# Patient Record
Sex: Female | Born: 1985 | Race: Black or African American | Hispanic: No | Marital: Single | Smoking: Never smoker
Health system: Southern US, Community
[De-identification: ages and names within clinical notes are randomized; demographics above are authoritative.]

## PROBLEM LIST (undated history)

## (undated) ENCOUNTER — Inpatient Hospital Stay (HOSPITAL_COMMUNITY): Payer: Self-pay

## (undated) DIAGNOSIS — A749 Chlamydial infection, unspecified: Secondary | ICD-10-CM

## (undated) DIAGNOSIS — R51 Headache: Secondary | ICD-10-CM

## (undated) DIAGNOSIS — O139 Gestational [pregnancy-induced] hypertension without significant proteinuria, unspecified trimester: Secondary | ICD-10-CM

## (undated) HISTORY — PX: WISDOM TOOTH EXTRACTION: SHX21

## (undated) HISTORY — DX: Headache: R51

## (undated) HISTORY — DX: Gestational (pregnancy-induced) hypertension without significant proteinuria, unspecified trimester: O13.9

## (undated) HISTORY — DX: Chlamydial infection, unspecified: A74.9

---

## 2005-12-30 ENCOUNTER — Emergency Department (HOSPITAL_COMMUNITY): Admission: EM | Admit: 2005-12-30 | Discharge: 2005-12-30 | Payer: Self-pay | Admitting: Emergency Medicine

## 2007-03-21 ENCOUNTER — Ambulatory Visit (HOSPITAL_COMMUNITY): Admission: RE | Admit: 2007-03-21 | Discharge: 2007-03-21 | Payer: Self-pay | Admitting: Obstetrics & Gynecology

## 2007-04-28 ENCOUNTER — Inpatient Hospital Stay (HOSPITAL_COMMUNITY): Admission: AD | Admit: 2007-04-28 | Discharge: 2007-04-30 | Payer: Self-pay | Admitting: Obstetrics & Gynecology

## 2007-04-28 ENCOUNTER — Ambulatory Visit: Payer: Self-pay | Admitting: *Deleted

## 2007-05-14 ENCOUNTER — Ambulatory Visit: Payer: Self-pay | Admitting: Obstetrics & Gynecology

## 2008-09-28 IMAGING — US US OB COMP +14 WK
1 series · 14 of 28 positions shown · non-contrast
Comparison: none

OBSTETRICAL ULTRASOUND:

 This ultrasound exam was performed in the [HOSPITAL] Ultrasound Department.  The OB US report was generated in the AS system, and faxed to the ordering physician.  This report is also available in [REDACTED] PACS.

[Series 1: us ob comp +14 wk · 0.35mm/px · 14 of 52 slices shown]
[im 2/52]
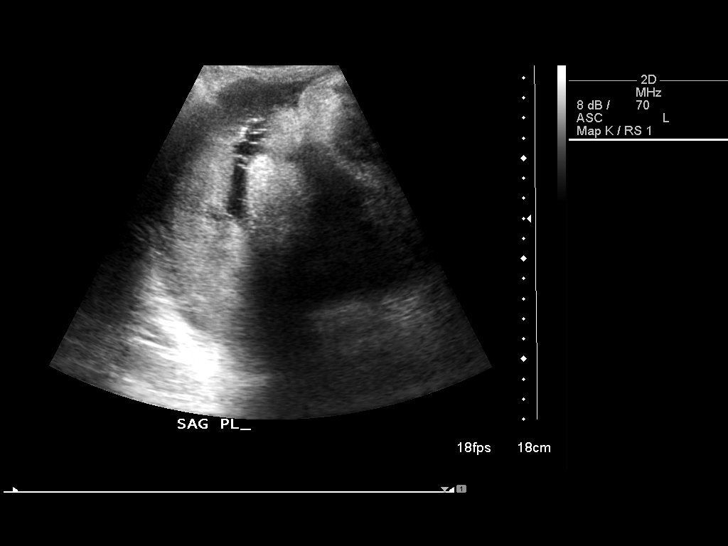
[im 6/52]
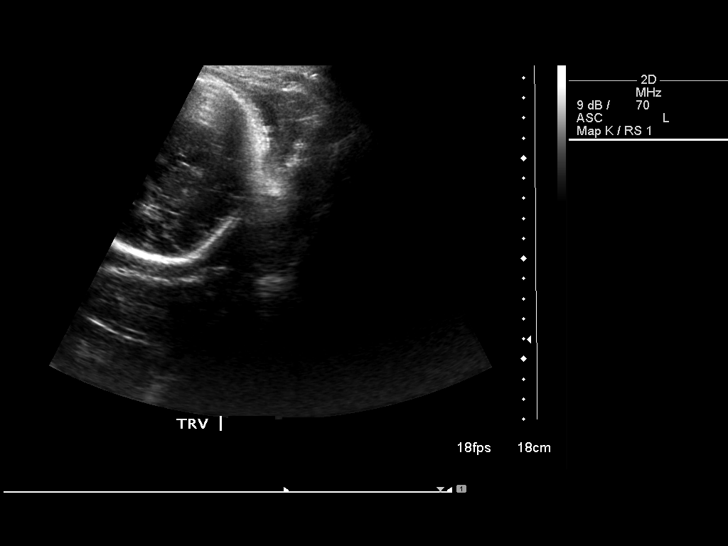
[im 10/52]
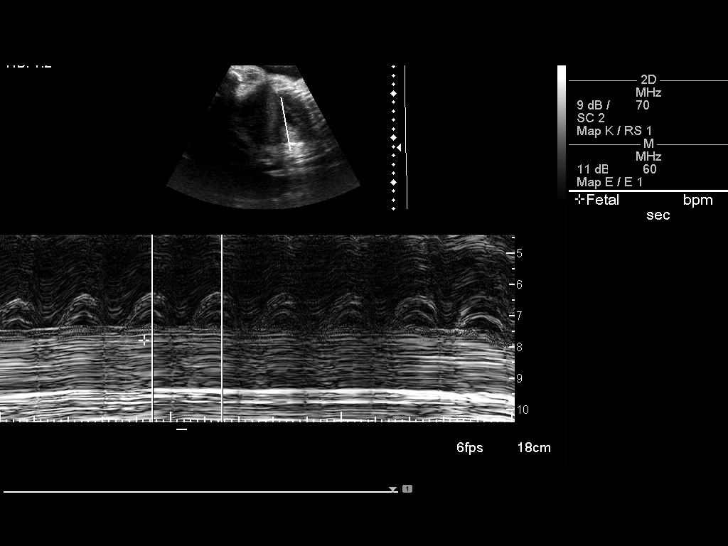
[im 14/52]
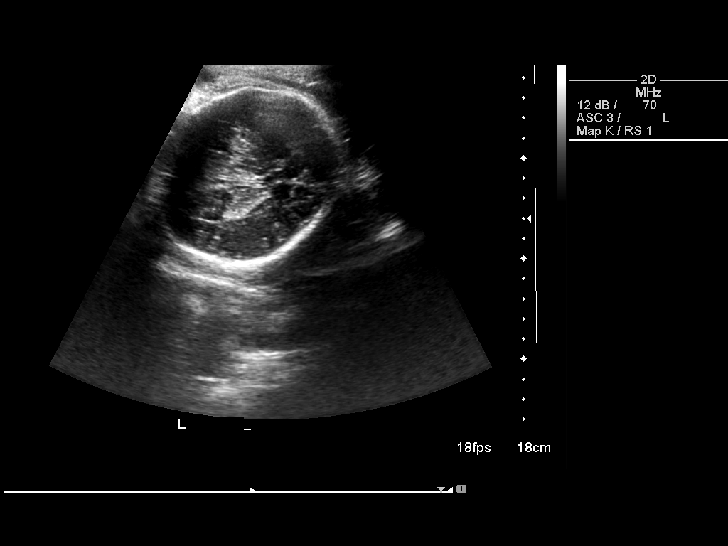
[im 18/52]
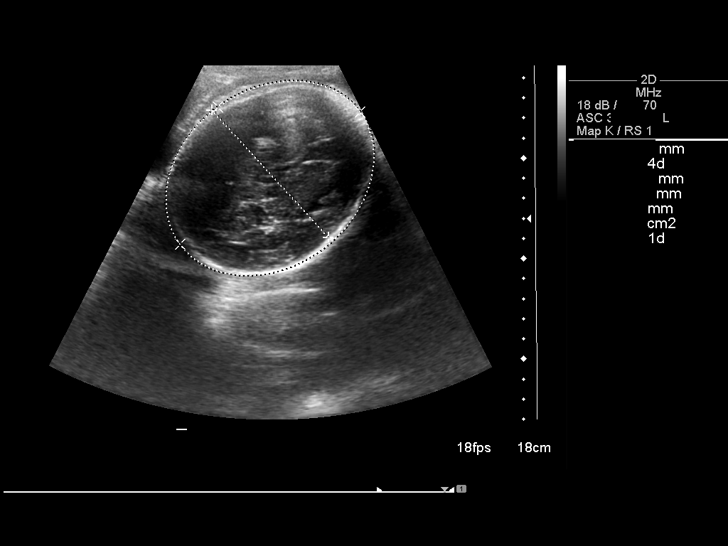
[im 21/52]
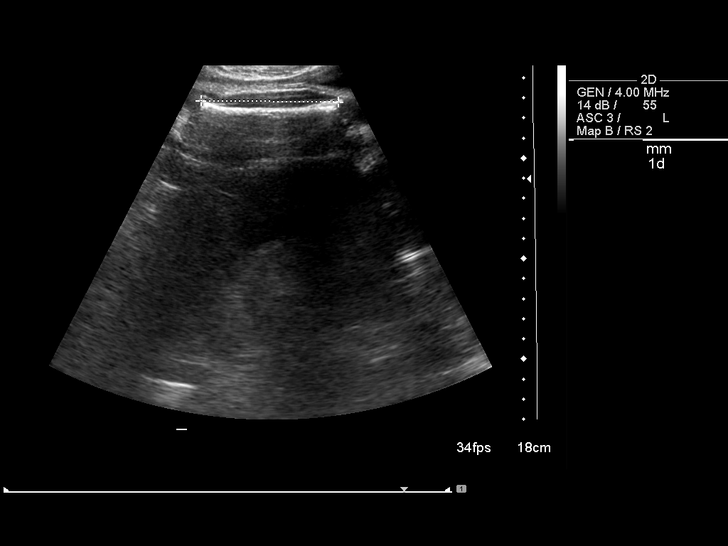
[im 25/52]
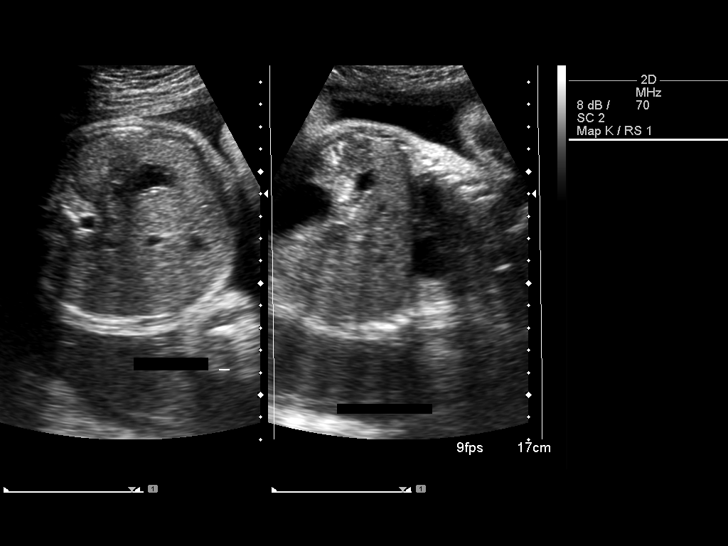
[im 29/52]
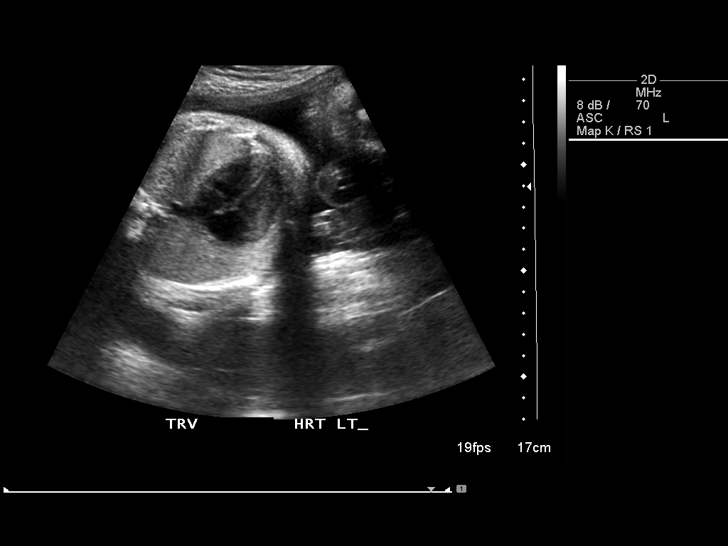
[im 33/52]
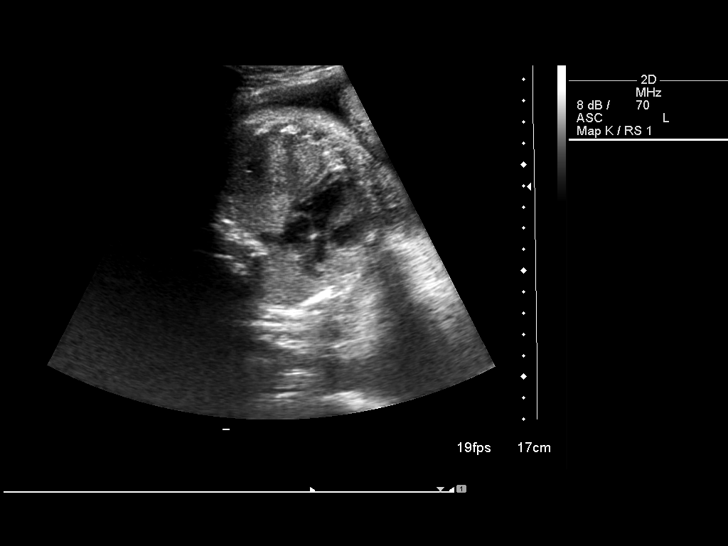
[im 36/52]
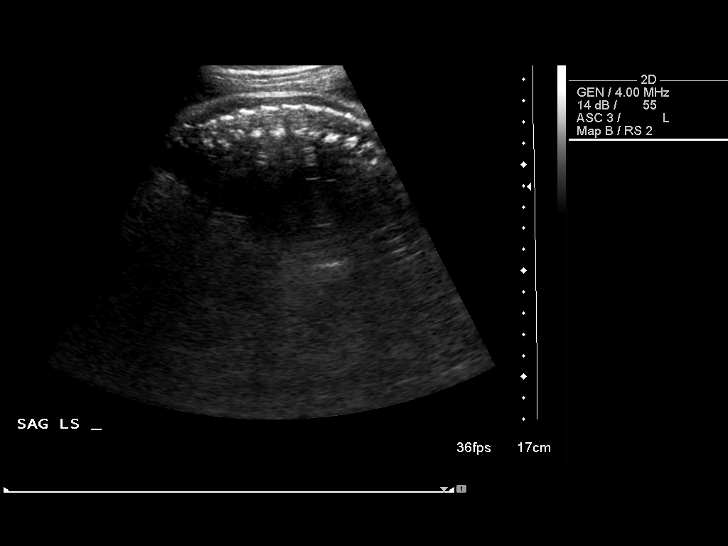
[im 40/52]
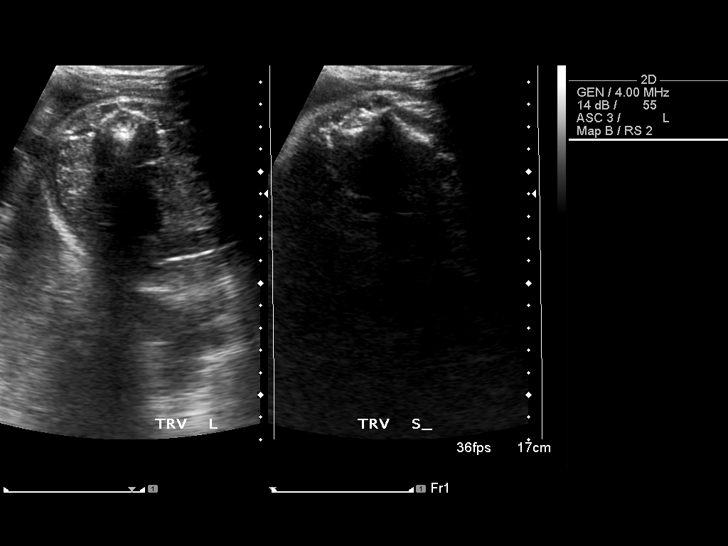
[im 44/52]
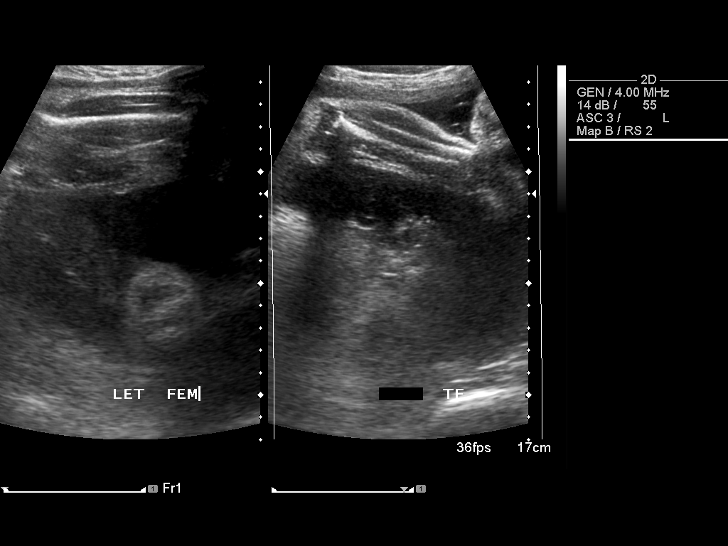
[im 48/52]
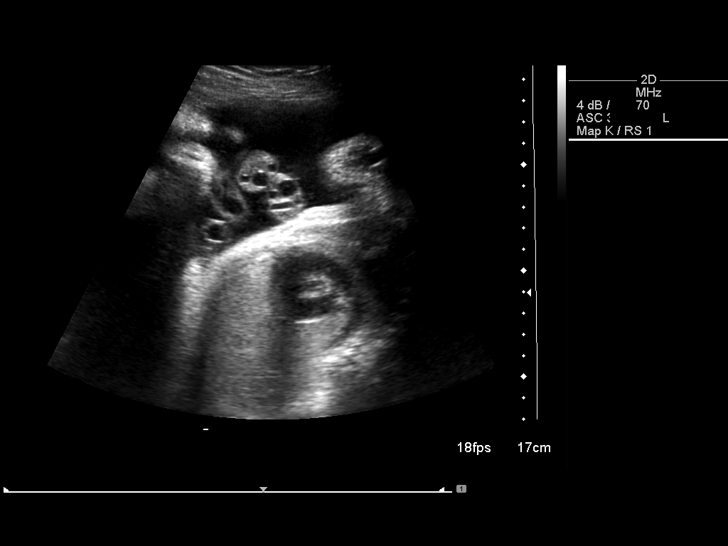
[im 52/52]
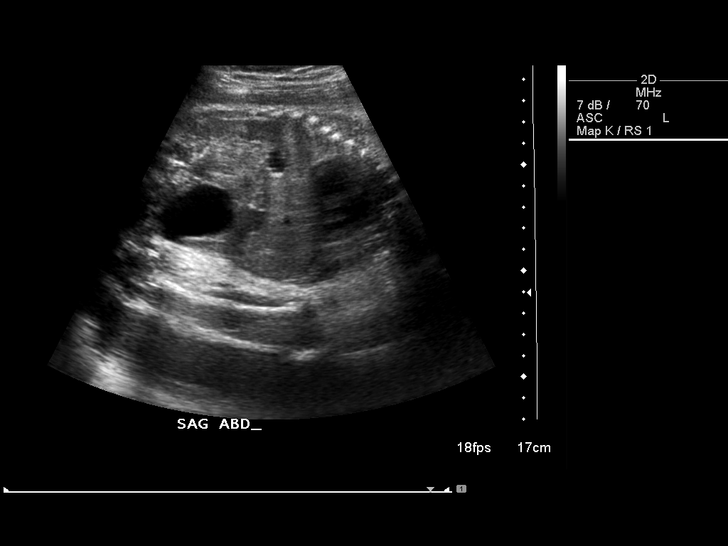

[14 of 28 positions shown; findings below may reference images not displayed]

IMPRESSION: See AS Obstetric US report.

## 2010-12-05 DIAGNOSIS — A749 Chlamydial infection, unspecified: Secondary | ICD-10-CM

## 2010-12-05 HISTORY — DX: Chlamydial infection, unspecified: A74.9

## 2011-01-02 ENCOUNTER — Ambulatory Visit
Admission: RE | Admit: 2011-01-02 | Discharge: 2011-01-02 | Payer: Self-pay | Source: Home / Self Care | Attending: Internal Medicine | Admitting: Internal Medicine

## 2011-01-02 ENCOUNTER — Encounter: Payer: Self-pay | Admitting: Internal Medicine

## 2011-01-02 DIAGNOSIS — IMO0002 Reserved for concepts with insufficient information to code with codable children: Secondary | ICD-10-CM | POA: Insufficient documentation

## 2011-01-02 DIAGNOSIS — G43009 Migraine without aura, not intractable, without status migrainosus: Secondary | ICD-10-CM | POA: Insufficient documentation

## 2011-01-02 LAB — CONVERTED CEMR LAB
ALT: 31 units/L (ref 0–35)
Albumin: 4.3 g/dL (ref 3.5–5.2)
Alkaline Phosphatase: 67 units/L (ref 39–117)
Basophils Absolute: 0 10*3/uL (ref 0.0–0.1)
Bilirubin, Direct: 0.1 mg/dL (ref 0.0–0.3)
Calcium: 9.9 mg/dL (ref 8.4–10.5)
Cholesterol: 149 mg/dL (ref 0–200)
Creatinine, Ser: 0.6 mg/dL (ref 0.4–1.2)
GFR calc non Af Amer: 156.96 mL/min (ref >60.00)
Glucose, Bld: 79 mg/dL (ref 70–99)
Hemoglobin: 15.3 g/dL — ABNORMAL HIGH (ref 12.0–15.0)
Lymphocytes Relative: 31.9 % (ref 12.0–46.0)
Monocytes Relative: 8.3 % (ref 3.0–12.0)
Neutro Abs: 2.4 10*3/uL (ref 1.4–7.7)
Neutrophils Relative %: 56.9 % (ref 43.0–77.0)
Platelets: 242 10*3/uL (ref 150.0–400.0)
RDW: 13.2 % (ref 11.5–14.6)
Sodium: 139 meq/L (ref 135–145)
Specific Gravity, Urine: 1.025 (ref 1.000–1.030)
Total Bilirubin: 1 mg/dL (ref 0.3–1.2)
Total Protein, Urine: NEGATIVE mg/dL
Total Protein: 7.7 g/dL (ref 6.0–8.3)
Triglycerides: 35 mg/dL (ref 0.0–149.0)
Urine Glucose: NEGATIVE mg/dL
Urobilinogen, UA: 0.2 (ref 0.0–1.0)
pH: 6 (ref 5.0–8.0)

## 2011-01-03 LAB — CONVERTED CEMR LAB
Chlamydia, Swab/Urine, PCR: NEGATIVE
GC Probe Amp, Urine: NEGATIVE

## 2011-02-01 NOTE — Assessment & Plan Note (Signed)
Summary: new pt/bcbs/#/lb   Vital Signs:  Patient profile:   25 year old female Height:      64 inches Weight:      156.50 pounds BMI:     26.96 O2 Sat:      98 % on Room air Temp:     99.4 degrees F oral Pulse rate:   79 / minute BP sitting:   110 / 82  (left arm) Cuff size:   regular  Vitals Entered By: Zella Ball Ewing CMA Duncan Dull) (January 02, 2011 9:40 AM)  O2 Flow:  Room air  Preventive Care Screening  Pap Smear:    Date:  12/05/2010    Results:  normal   Last Tetanus Booster:    Date:  08/31/2010    Results:  Historical      had flu shot at work earlier this season  CC: New Patient/RE   CC:  New Patient/RE.  History of Present Illness: here for wellness and medical complaints;   overall doing ok for the most part and Pt denies CP, worsening sob, doe, wheezing, orthopnea, pnd, worsening LE edema, palps, dizziness or syncope  Pt denies new neuro symptoms such as  facial or extremity weakness  Pt denies polydipsia, polyuria   Overall good compliance with meds, not really trying to follow low chol diet, wt stable, little excercise however  No fever, wt loss, night sweats, loss of appetite or other constitutional symptoms  Overall good compliance with meds, and good tolerability. Denies worsening depressive symptoms, suicidal ideation, or panic.  Pt states good ability with ADL's, low fall risk, home safety reviewed and adequate, no significant change in hearing or vision, trying to follow lower chol diet, and occasionally active only with regular excercise. Does have occaisonal migraine type HA with throbbing more on the right forehead area, without n/v.  Has elev BP at home with the HA's but not o/w  Pt asks for STD check/labs today though she has not had specific symtpoms such as vag d/c, rash/sore/ulcers/pain.  Did incidently have mild URI in the past wk, but seems mostly improved now, is unaware of any fever today.  No chills, ST, cough.  Preventive Screening-Counseling &  Management  Alcohol-Tobacco     Smoking Status: never      Drug Use:  no.    Problems Prior to Update: 1)  Sexually Transmitted Disease, Exposure To  (ICD-V01.6) 2)  Hypertension, Gestational  (ICD-642.90) 3)  Migraine, Common  (ICD-346.10)  Medications Prior to Update: 1)  None  Current Medications (verified): 1)  Sumatriptan Succinate 100 Mg Tabs (Sumatriptan Succinate) .... Use Asd 1 By Mouth Every Other Day As Needed 2)  Excedrin Migraine 250-250-65 Mg Tabs (Aspirin-Acetaminophen-Caffeine) .Marland Kitchen.. 1po Every Other Day As Needed  Allergies (verified): No Known Drug Allergies  Past History:  Family History: Last updated: 01/02/2011 grandparents with DM, HTN father with gout p-aunt with lung cancer m-uncle with lung cancer brother with HTN (14 yrs older than the pt)  Social History: Last updated: 01/02/2011 Single 1 child  work  - Therapist, occupational -  Alcohol use-yes Never Smoked Drug use-no  Risk Factors: Smoking Status: never (01/02/2011)  Past Medical History: migraine  hx of gestational HTN  Past Surgical History: Denies surgical history  Family History: Reviewed history and no changes required. grandparents with DM, HTN father with gout p-aunt with lung cancer m-uncle with lung cancer brother with HTN (14 yrs older than the pt)  Social History: Reviewed history and no changes  required. Single 1 child  work  - Therapist, occupational -  Alcohol use-yes Never Smoked Drug use-no Smoking Status:  never Drug Use:  no  Review of Systems  The patient denies anorexia, fever, vision loss, decreased hearing, hoarseness, chest pain, syncope, dyspnea on exertion, peripheral edema, prolonged cough, hemoptysis, abdominal pain, melena, hematochezia, severe indigestion/heartburn, hematuria, incontinence, suspicious skin lesions, transient blindness, difficulty walking, depression, unusual weight change, abnormal bleeding, enlarged lymph nodes, and angioedema.         all  otherwise negative per pt -  no GU symptoms such as pain on urination, freq, urgency, hematuria  Physical Exam  General:  alert and overweight-appearing.  , not ill appearing Head:  normocephalic and atraumatic.   Eyes:  vision grossly intact, pupils equal, and pupils round.   Ears:  R ear normal and L ear normal.   Nose:  no external deformity and no nasal discharge.   Mouth:  pharyngeal erythema and fair dentition.   Neck:  supple and no masses.   Lungs:  normal respiratory effort and normal breath sounds.   Heart:  normal rate and regular rhythm.   Abdomen:  soft, non-tender, and normal bowel sounds.   Msk:  no joint tenderness and no joint swelling.   Extremities:  no edema, no erythema  Neurologic:  strength normal in all extremities, sensation intact to light touch, and gait normal.   Skin:  color normal and no rashes.   Psych:  not depressed appearing and slightly anxious.     Impression & Recommendations:  Problem # 1:  Preventive Health Care (ICD-V70.0) Overall doing well, age appropriate education and counseling updated, referral for preventive services and immunizations addressed, dietary counseling and smoking status adressed , most recent labs reviewed I have personally reviewed and have noted 1.The patient's medical and social history 2.Their use of alcohol, tobacco or illicit drugs 3.Their current medications and supplements 4. Functional ability including ADL's, fall risk, home safety risk, hearing & visual impairment  5.Diet and physical activities 6.Evidence for depression or mood disorders The patients weight, height, BMI  have been recorded in the chart I have made referrals, counseling and provided education to the patient based review of the above   also for Gardasil #1 today with f/u shots at 2 and 6 mo to be scheduled  Problem # 2:  MIGRAINE, COMMON (ICD-346.10)  Her updated medication list for this problem includes:    Sumatriptan Succinate 100 Mg Tabs  (Sumatriptan succinate) ..... Use asd 1 by mouth every other day as needed    Excedrin Migraine 250-250-65 Mg Tabs (Aspirin-acetaminophen-caffeine) .Marland Kitchen... 1po every other day as needed treat as above, f/u any worsening signs or symptoms  - with the sumatriptan for the more severe HA's, consider HA wellness referral  Problem # 3:  SEXUALLY TRANSMITTED DISEASE, EXPOSURE TO (ICD-V01.6) pt reqeusts lab eval, though states asympt, GU exam deferred Orders: T-HIV-1 (Screen) (47829) T-Herpes Simplex Type 2 (56213-08657) T-Syphilis Test (RPR) [phone]) T-Chlamydia & GC Probe, Urine (87491/[id number]) TLB-BMP (Basic Metabolic Panel-BMET) (80048-METABOL) TLB-CBC Platelet - w/Differential (85025-CBCD) TLB-Hepatic/Liver Function Pnl (80076-HEPATIC) TLB-Lipid Panel (80061-LIPID) TLB-TSH (Thyroid Stimulating Hormone) (84443-TSH) TLB-Udip ONLY (81003-UDIP)  Complete Medication List: 1)  Sumatriptan Succinate 100 Mg Tabs (Sumatriptan succinate) .... Use asd 1 by mouth every other day as needed 2)  Excedrin Migraine 250-250-65 Mg Tabs (Aspirin-acetaminophen-caffeine) .Marland Kitchen.. 1po every other day as needed  Other Orders: HPV Vaccine - 3 sched doses - IM (41324) Admin 1st Vaccine (40102)  Patient Instructions: 1)  you had the Gardasil shot #1 today 2)  Please make Nurse visit for Gardasil #2 shot in 2 months, and Gardisil #3 shot in 6 months 3)  Please go to the Lab in the basement for your blood and/or urine tests today  4)  Please call the number on the [facility] Card for results of your testing  5)  Please take all new medications as prescribed  - the generic imitrex for the headaches as needed 6)  Continue all previous medications as before this visit 7)  You can also take Excedrin Migraine OTC for milder migraine 8)  Please schedule a follow-up appointment in 1 year or as needed Prescriptions: SUMATRIPTAN SUCCINATE 100 MG TABS (SUMATRIPTAN SUCCINATE) use asd 1 by mouth every other day as needed  #9 x  11   Entered and Authorized by:   Corwin Levins MD   Signed by:   Corwin Levins MD on 01/02/2011   Method used:   Print then Give to Patient   RxID:   [id number]    Orders Added: 1)  T-HIV-1 (Screen) [63875] 2)  T-Herpes Simplex Type 2 [64332-95188] 3)  T-Syphilis Test (RPR) [41660-63016] 4)  T-Chlamydia & GC Probe, Urine [87491/[id number]] 5)  HPV Vaccine - 3 sched doses - IM [90649] 6)  Admin 1st Vaccine [90471] 7)  TLB-BMP (Basic Metabolic Panel-BMET) [80048-METABOL] 8)  TLB-CBC Platelet - w/Differential [85025-CBCD] 9)  TLB-Hepatic/Liver Function Pnl [80076-HEPATIC] 10)  TLB-Lipid Panel [80061-LIPID] 11)  TLB-TSH (Thyroid Stimulating Hormone) [84443-TSH] 12)  TLB-Udip ONLY [81003-UDIP] 13)  Est. Patient 18-39 years [99395]   Immunization History:  Tetanus/Td Immunization History:    Tetanus/Td:  historical (08/31/2010)  Immunizations Administered:  HPV # 1:    Vaccine Type: Gardasil    Site: left buttock    Mfr: Merck    Dose: 0.5 ml    Route: IM    Given by: Zella Ball Ewing CMA (AAMA)    Exp. Date: 04/07/2012    Lot #: [id number]    VIS given: 05/02/10 version given January 02, 2011.   Immunization History:  Tetanus/Td Immunization History:    Tetanus/Td:  Historical (08/31/2010)  Immunizations Administered:  HPV # 1:    Vaccine Type: Gardasil    Site: left buttock    Mfr: Merck    Dose: 0.5 ml    Route: IM    Given by: Zella Ball Ewing CMA (AAMA)    Exp. Date: 04/07/2012    Lot #: [id number]    VIS given: 05/02/10 version given January 02, 2011.

## 2011-03-06 ENCOUNTER — Ambulatory Visit (INDEPENDENT_AMBULATORY_CARE_PROVIDER_SITE_OTHER): Payer: 59

## 2011-03-06 ENCOUNTER — Encounter: Payer: Self-pay | Admitting: Internal Medicine

## 2011-03-06 DIAGNOSIS — Z23 Encounter for immunization: Secondary | ICD-10-CM

## 2011-03-13 NOTE — Assessment & Plan Note (Signed)
Summary: gardisil/ jwj/ nws  Nurse Visit   Allergies: No Known Drug Allergies  Immunizations Administered:  HPV # 2:    Vaccine Type: Gardasil    Site: left deltoid    Mfr: Merck    Dose: 0.5 ml    Route: IM    Given by: Burnard Leigh CMA(AAMA)    Exp. Date: 04/07/2012    Lot #: [id number]    VIS given: 05/02/10 version given March 06, 2011.  Orders Added: 1)  HPV Vaccine - 3 sched doses - IM [90649] 2)  Admin 1st Vaccine [19147]

## 2011-05-15 NOTE — Discharge Summary (Signed)
Angel Weber, Angel Weber              ACCOUNT NO.:  [id number]   MEDICAL RECORD NO.:  [id number]          PATIENT TYPE:  INP   LOCATION:  9124                          FACILITY:  [facility]   PHYSICIAN:  Paticia Stack, MD     DATE OF BIRTH:  06-14-1986   DATE OF ADMISSION:  04/28/2007  DATE OF DISCHARGE:  04/30/2007                               DISCHARGE SUMMARY   DISCHARGE SUMMARY   ADMISSION DIAGNOSES:  1. Term intrauterine pregnancy at 40+ weeks' gestation.  2. Anemia of pregnancy.  3. Mild preeclampsia.   DISCHARGE DIAGNOSES:  1. Term intrauterine pregnancy, delivered by spontaneous vaginal      delivery.  2. Mild preeclampsia.  3. Anemia of pregnancy.   HOSPITAL COURSE:  This is a 25 year old gravida 1 at 67 weeks 2 days'  gestation who presented in active labor.  On admission, was found to  have elevated blood pressures, ranging 120 to 150 over 80 to 100.  PIH  labs were obtained, which showed platelets at 155, which had been 303  one month prior.  Also, a uric acid of 6.1 and a creatinine of 0.75.  The patient had no symptoms of preeclampsia.  She was started on  magnesium for her elevated blood pressures, and proteinuria.  She did  have 100 protein in her urine.  The patient progressed to deliver a  viable female infant with Apgars 8 and 9 at 1 and 5 minutes, respectively.  Had a second-degree laceration that was repaired with 3-0 repeat.  She  was continued on magnesium sulfate for 24 hours postpartum, started to  diurese well.  Had no chest pain, shortness of breath, or complications.  She was transferred to the floor on postpartum day 1, and continued to  do well postpartum.  On postpartum day 2, the patient was found to have  elevated blood pressure of 156/106.  She was started on atenolol 25 mg  p.o. b.i.d.  Her blood pressure improved with this medicine, and she was  thought to be stable for discharge on postpartum day number 2.   DISPOSITION:  She was discharged with  instructions to follow up at the  GYN clinic in 2 weeks for blood pressure check.  She was given the  telephone number [phone], in which to call and make that appointment.  She is also to follow up at the health department in 6 weeks for a  postpartum visit.  Additional instructions included nothing per vagina x  6 weeks.  No heavy lifting.  Diet was regular.  Activity as tolerated.   DISCHARGE MEDICATIONS:  1. Motrin 600 mg, take 1 q. 6 hours p.r.n. pain.  2. Percocet 5/325 mg, take 1 every 6 hours as needed for pain.  3. Micronor 1 daily.  4. Prenatal vitamins daily.  5. Atenolol 25 mg p.o. b.i.d.   She is also to receive an IUD at her 6-week postpartum visit.           ______________________________  Paticia Stack, MD     LNJ/MEDQ  D:  04/30/2007  T:  04/30/2007  Job:  [id number] 

## 2011-07-03 ENCOUNTER — Ambulatory Visit (INDEPENDENT_AMBULATORY_CARE_PROVIDER_SITE_OTHER): Payer: 59

## 2011-07-03 DIAGNOSIS — Z23 Encounter for immunization: Secondary | ICD-10-CM

## 2011-08-08 ENCOUNTER — Encounter: Payer: Self-pay | Admitting: *Deleted

## 2011-08-08 ENCOUNTER — Emergency Department (HOSPITAL_BASED_OUTPATIENT_CLINIC_OR_DEPARTMENT_OTHER)
Admission: EM | Admit: 2011-08-08 | Discharge: 2011-08-09 | Disposition: A | Discharge status: Home / Self Care | Payer: No Typology Code available for payment source | Attending: Emergency Medicine | Admitting: Emergency Medicine

## 2011-08-08 DIAGNOSIS — Y92410 Unspecified street and highway as the place of occurrence of the external cause: Secondary | ICD-10-CM | POA: Insufficient documentation

## 2011-08-08 DIAGNOSIS — IMO0002 Reserved for concepts with insufficient information to code with codable children: Secondary | ICD-10-CM | POA: Insufficient documentation
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
## 2011-08-09 MED ORDER — IBUPROFEN 600 MG PO TABS: 600.0000 mg | ORAL_TABLET | Freq: Four times a day (QID) | ORAL | Status: AC | PRN

## 2011-08-09 MED ORDER — DIAZEPAM 5 MG PO TABS: 5.0000 mg | ORAL_TABLET | Freq: Two times a day (BID) | ORAL | Status: AC

## 2011-08-09 MED ORDER — OXYCODONE-ACETAMINOPHEN 5-325 MG PO TABS: 2.0000 | ORAL_TABLET | ORAL | Status: AC | PRN

## 2011-08-09 NOTE — Discharge Instructions (Signed)
Shoulder Sprain  A shoulder sprain is the result of damage to the tough, fiber-like tissues (ligaments) that help hold your shoulder in place. The ligaments could be stretched or torn. Besides the main shoulder joint (the ball and socket), there are several smaller joints that connect the bones in this area. A sprain usually involves one of those joints. Most often it is the acromioclavicular (or AC) joint. That is the joint that connects the collarbone (clavicle) and the shoulder blade (scapula) at the top point of the shoulder blade (acromion).  A shoulder sprain is a mild form of what is called a shoulder separation. Recovering from a shoulder sprain may take some time. For some, pain lingers for several months. Most people recover without long term problems.  CAUSES   A shoulder sprain is usually caused by some kind of trauma. This might be:    Falling on an outstretched arm.    Being hit hard on the shoulder.    Twisting the arm.    Shoulder sprains are more likely to occur to people who:    Play sports.    Have balance or coordination problems.   SYMPTOMS   Pain when you move your shoulder.    Limited ability to move the shoulder.    Swelling and tenderness on top of the shoulder.    Redness or warmth in the shoulder.    Bruising.    A change in the shape of the shoulder.   DIAGNOSIS  Your healthcare provider will probably:   Ask about your symptoms.    Ask about recent activity that might have caused those symptoms.    Examine your shoulder. You may be asked to do simple exercises to test movement. The other shoulder will be examined for comparison.    Order some tests that provide a look inside the body. They can show the extent of the injury. The tests could include:    X-rays.    CT (Computed tomography) scan.    MRI (Magnetic resonance imaging) scan.   TREATMENT  How long it takes to recover from a shoulder sprain depends on how severe it was. Treatment options may include:    Rest. You should not use the arm and shoulder until it heals.    Ice. For two or three days after the injury, put an ice pack on the shoulder up to four times a day. It should stay on for 15 to 20 minutes each time. Wrap the ice in a towel so it does not touch your skin.    Over-the-counter medicine to relieve pain.    A sling or brace. This will keep the arm still while the shoulder is healing.    Physical therapy or rehabilitation exercises. These will help you regain strength and motion. Ask your healthcare provider when it is OK to begin these exercises.    Surgery. The need for surgery is rare with a sprained shoulder. Some people, though, need surgery to keep the joint in place and reduce pain.   POSSIBLE COMPLICATIONS   Loss of full shoulder motion.    Ongoing shoulder pain.   HOME CARE INSTRUCTIONS   Ask your healthcare provider about what you should and should not do while your shoulder heals.    Make sure you know how to apply ice to the correct area of your shoulder.    Talk with your healthcare provider about which medications for pain and swelling should be used.    If rehabilitation   therapy will be needed, ask your healthcare provider to refer you to a therapist. If it is not recommended, then ask about at-home exercises. Find out when exercise should begin.   SEEK MEDICAL CARE IF:   You develop a fever above 101.5F (38.6C).    Pain, swelling or redness at the joint increases.   SEEK IMMEDIATE MEDICAL CARE IF:   You develop a fever above 102.0F (38.9C).    You cannot move your arm or shoulder.   Document Released: 05/05/2009    [employer] Patient Information 2011 [employer].

## 2012-01-12 ENCOUNTER — Encounter: Payer: Self-pay | Admitting: Internal Medicine

## 2012-01-12 DIAGNOSIS — Z0000 Encounter for general adult medical examination without abnormal findings: Secondary | ICD-10-CM | POA: Insufficient documentation

## 2012-01-14 ENCOUNTER — Encounter: Payer: Self-pay | Admitting: Internal Medicine

## 2012-01-15 ENCOUNTER — Ambulatory Visit (INDEPENDENT_AMBULATORY_CARE_PROVIDER_SITE_OTHER): Payer: 59 | Admitting: Internal Medicine

## 2012-01-15 ENCOUNTER — Encounter: Payer: Self-pay | Admitting: Internal Medicine

## 2012-01-15 VITALS — BP 112/62 | HR 102 | Temp 97.4°F | Ht 64.0 in | Wt 168.4 lb

## 2012-01-15 DIAGNOSIS — J209 Acute bronchitis, unspecified: Secondary | ICD-10-CM | POA: Insufficient documentation

## 2012-01-15 MED ORDER — HYDROCODONE-HOMATROPINE 5-1.5 MG/5ML PO SYRP: 5.0000 mL | ORAL_SOLUTION | Freq: Four times a day (QID) | ORAL | Status: AC | PRN

## 2012-01-15 MED ORDER — AZITHROMYCIN 250 MG PO TABS: ORAL_TABLET | ORAL | Status: AC

## 2012-01-15 NOTE — Progress Notes (Signed)
  Subjective:    Patient ID: Angel Weber, female    DOB: Jul 24, 1986, 26 y.o.   MRN: [id number]  HPI  Here with acute onset mild to mod 2-3 days ST, HA, general weakness and malaise, with prod cough greenish sputum, but Pt denies chest pain, increased sob or doe, wheezing, orthopnea, PND, increased LE swelling, palpitations, dizziness or syncope.  Past Medical History  Diagnosis Date  . HYPERTENSION, GESTATIONAL 01/02/2011  . MIGRAINE, COMMON 01/02/2011   No past surgical history on file.  reports that she has never smoked. She does not have any smokeless tobacco history on file. She reports that she drinks alcohol. She reports that she does not use illicit drugs. family history includes Diabetes in her other; Gout in her father; Hypertension in her brother; and Lung cancer in her maternal uncle and paternal aunt. No Known Allergies Current Outpatient Prescriptions on File Prior to Visit  Medication Sig Dispense Refill  . ibuprofen (ADVIL,MOTRIN) 200 MG tablet Take 200 mg by mouth once.        . Multiple Vitamin (MULTIVITAMIN) tablet Take 1 tablet by mouth daily.         Review of Systems All otherwise neg per pt     Objective:   Physical Exam BP 112/62  Pulse 102  Temp(Src) 97.4 F (36.3 C) (Oral)  Ht 5\' 4"  (1.626 m)  Wt 168 lb 6 oz (76.374 kg)  BMI 28.90 kg/m2  SpO2 98% Physical Exam  VS noted, mild ill Constitutional: Pt appears well-developed and well-nourished.  HENT: Head: Normocephalic.  Right Ear: External ear normal.  Left Ear: External ear normal.  Bilat tm's mild erythema.  Sinus tender.  Pharynx mild erythema Eyes: Conjunctivae and EOM are normal. Pupils are equal, round, and reactive to light.  Neck: Normal range of motion. Neck supple.  Cardiovascular: Normal rate and regular rhythm.   Pulmonary/Chest: Effort normal and breath sounds normal.  Neurological: Pt is alert. No cranial nerve deficit.  Skin: Skin is warm. No erythema.  Psychiatric: Pt behavior is  normal. Thought content normal.     Assessment & Plan:

## 2012-01-15 NOTE — Patient Instructions (Signed)
Take all new medications as prescribed Continue all other medications as before  

## 2012-01-15 NOTE — Assessment & Plan Note (Signed)
Mild to mod, for antibx course,  to f/u any worsening symptoms or concerns 

## 2012-04-22 ENCOUNTER — Other Ambulatory Visit: Payer: Self-pay | Admitting: Obstetrics and Gynecology

## 2012-04-22 MED ORDER — LEVONORGEST-ETH ESTRAD 91-DAY 0.15-0.03 MG PO TABS: 1.0000 | ORAL_TABLET | Freq: Every day | ORAL | Status: DC

## 2012-04-22 NOTE — Telephone Encounter (Signed)
Routed to triage 

## 2012-04-22 NOTE — Telephone Encounter (Signed)
Spoke with pt rgd msg pt wants rf on rx until aex 05/08/12 with AR pt missed two days of pill no recent intercourse advised pt double up on pills today and tomorrow use condoms for bum for this month per protocol rx sent to pharm pt voice understanding

## 2012-05-08 ENCOUNTER — Encounter: Payer: Self-pay | Admitting: Obstetrics and Gynecology

## 2012-05-08 ENCOUNTER — Ambulatory Visit (INDEPENDENT_AMBULATORY_CARE_PROVIDER_SITE_OTHER): Payer: 59 | Admitting: Obstetrics and Gynecology

## 2012-05-08 VITALS — BP 132/82 | Resp 16 | Ht 64.0 in | Wt 161.0 lb

## 2012-05-08 DIAGNOSIS — Z124 Encounter for screening for malignant neoplasm of cervix: Secondary | ICD-10-CM

## 2012-05-08 DIAGNOSIS — Z113 Encounter for screening for infections with a predominantly sexual mode of transmission: Secondary | ICD-10-CM

## 2012-05-08 DIAGNOSIS — Z01419 Encounter for gynecological examination (general) (routine) without abnormal findings: Secondary | ICD-10-CM

## 2012-05-08 MED ORDER — LEVONORGEST-ETH ESTRAD 91-DAY 0.15-0.03 MG PO TABS: 1.0000 | ORAL_TABLET | Freq: Every day | ORAL | Status: DC

## 2012-05-08 NOTE — Progress Notes (Signed)
Contraception Debarah Crape Last pap 2011 Last Mammo never Last Colonoscopy never Last Dexa Scan never Primary MD Dr Oliver Barre Abuse at Home No  No complaints  Filed Vitals:   05/08/12 1011  BP: 132/82  Resp: 16   ROS: noncontributory  Physical Examination: General appearance - alert, well appearing, and in no distress Neck - supple, no significant adenopathy Chest - clear to auscultation, no wheezes, rales or rhonchi, symmetric air entry Heart - normal rate and regular rhythm Abdomen - soft, nontender, nondistended, no masses or organomegaly Breasts - breasts appear normal, no suspicious masses, no skin or nipple changes or axillary nodes Pelvic - normal external genitalia, vulva, vagina, cervix, uterus and adnexa Back exam - no CVAT Extremities - no edema, redness or tenderness in the calves or thighs  A/P Pap, GC/CT RTO for AEX

## 2012-05-12 LAB — PAP IG, CT-NG, RFX HPV ASCU

## 2012-05-14 ENCOUNTER — Emergency Department (HOSPITAL_BASED_OUTPATIENT_CLINIC_OR_DEPARTMENT_OTHER)
Admission: EM | Admit: 2012-05-14 | Discharge: 2012-05-14 | Disposition: A | Discharge status: Home / Self Care | Payer: 59 | Attending: Emergency Medicine | Admitting: Emergency Medicine

## 2012-05-14 ENCOUNTER — Encounter (HOSPITAL_BASED_OUTPATIENT_CLINIC_OR_DEPARTMENT_OTHER): Payer: Self-pay | Admitting: *Deleted

## 2012-05-14 DIAGNOSIS — R21 Rash and other nonspecific skin eruption: Secondary | ICD-10-CM | POA: Insufficient documentation

## 2012-05-14 MED ORDER — PREDNISONE (PAK) 10 MG PO TABS: 10.0000 mg | ORAL_TABLET | Freq: Every day | ORAL | Status: AC

## 2012-05-14 NOTE — ED Provider Notes (Signed)
Medical screening examination/treatment/procedure(s) were performed by non-physician practitioner and as supervising physician I was immediately available for consultation/collaboration.   Tressa Maldonado B. Sarena Jezek, MD 05/14/12 1722 
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
## 2012-05-14 NOTE — ED Provider Notes (Signed)
History     CSN: [id number]  Arrival date & time 05/14/12  1640   First MD Initiated Contact with Patient 05/14/12 1644      Chief Complaint  Patient presents with  . Rash    (Consider location/radiation/quality/duration/timing/severity/associated sxs/prior treatment) Patient is a 26 y.o. female presenting with rash. The history is provided by the patient. No language interpreter was used.  Rash  This is a new problem. The current episode started more than 2 days ago. The problem has not changed since onset.The problem is associated with an unknown factor. There has been no fever. The rash is present on the face. The patient is experiencing no pain. The pain has been constant since onset. Associated symptoms include itching. Pertinent negatives include no pain. Treatments tried: t gel hair wash.    Past Medical History  Diagnosis Date  . HYPERTENSION, GESTATIONAL 01/02/2011  . MIGRAINE, COMMON 01/02/2011    History reviewed. No pertinent past surgical history.  Family History  Problem Relation Age of Onset  . Gout Father   . Hypertension Brother   . Lung cancer Maternal Uncle   . Lung cancer Paternal Aunt   . Diabetes Other     History  Substance Use Topics  . Smoking status: Never Smoker   . Smokeless tobacco: Not on file  . Alcohol Use: Yes    OB History    Grav Para Term Preterm Abortions TAB SAB Ect Mult Living                  Review of Systems  Constitutional: Positive for fatigue.  Respiratory: Negative.   Cardiovascular: Negative.   Skin: Positive for itching and rash.    Allergies  Review of patient's allergies indicates no known allergies.  Home Medications   Current Outpatient Rx  Name Route Sig Dispense Refill  . IBUPROFEN 200 MG PO TABS Oral Take 200 mg by mouth once.      Marland Kitchen LEVONORGEST-ETH ESTRAD 91-DAY 0.15-0.03 MG PO TABS Oral Take 1 tablet by mouth daily. 1 Package 3  . ONE-DAILY MULTI VITAMINS PO TABS Oral Take 1 tablet by mouth daily.         BP 127/73  Temp(Src) 98.3 F (36.8 C) (Oral)  Resp 16  Ht 4' (1.219 m)  Wt 160 lb (72.576 kg)  BMI 48.83 kg/m2  SpO2 100%  LMP 04/15/2012  Physical Exam  Nursing note and vitals reviewed. Constitutional: She is oriented to person, place, and time. She appears well-developed and well-nourished.  Cardiovascular: Normal rate and regular rhythm.   Pulmonary/Chest: Effort normal and breath sounds normal.  Musculoskeletal: Normal range of motion.  Neurological: She is alert and oriented to person, place, and time.  Skin:       Pt has red scaly rash to around the hair line    ED Course  Procedures (including critical care time)  Labs Reviewed - No data to display No results found.   1. Rash       MDM  No new lotions:pt will treat with steroids for a couple of days        Teressa Lower, NP 05/14/12 1712

## 2012-05-14 NOTE — Discharge Instructions (Signed)
Rash A rash is a change in the color or feel of your skin. There are many different types of rashes. You may have other problems along with your rash. HOME CARE  Avoid the thing that caused your rash.   Do not scratch your rash.   You may take cools baths to help stop itching.   Only take medicines as told by your doctor.   Keep all doctor visits as told.  GET HELP RIGHT AWAY IF:   Your pain, puffiness (swelling), or redness gets worse.   You have a fever.   You have new or severe problems.   You have body aches, watery poop (diarrhea), or you throw up (vomit).   Your rash is not better after 3 days.  MAKE SURE YOU:   Understand these instructions.   Will watch your condition.   Will get help right away if you are not doing well or get worse.  Document Released: 06/04/2008 Document Revised: 12/06/2011 Document Reviewed: 10/01/2011 Exit[employer] Patient Information 2012 [employer]. 

## 2012-06-03 ENCOUNTER — Ambulatory Visit (INDEPENDENT_AMBULATORY_CARE_PROVIDER_SITE_OTHER): Payer: 59 | Admitting: Internal Medicine

## 2012-06-03 ENCOUNTER — Encounter: Payer: Self-pay | Admitting: Internal Medicine

## 2012-06-03 VITALS — BP 120/82 | HR 88 | Temp 99.1°F | Ht 64.0 in | Wt 164.0 lb

## 2012-06-03 DIAGNOSIS — R21 Rash and other nonspecific skin eruption: Secondary | ICD-10-CM

## 2012-06-03 MED ORDER — PREDNISONE 10 MG PO TABS: 10.0000 mg | ORAL_TABLET | Freq: Every day | ORAL | Status: DC

## 2012-06-03 MED ORDER — METHYLPREDNISOLONE ACETATE 80 MG/ML IJ SUSP
120.0000 mg | Freq: Once | INTRAMUSCULAR | Status: AC
  Administered 2012-06-03: 120 mg via INTRAMUSCULAR

## 2012-06-03 NOTE — Patient Instructions (Addendum)
Please have a friend take pictures on your phone of your rash to the forehead, nasal area and back of the neck You had the steroid shot today Take all new medications as prescribed - the prednisone Please stop your shampoo you normally use, and consider change to another such as selsun blue (which normally does not cause any rash) Continue all other medications as before You will be contacted regarding the referral for: dermatology

## 2012-06-08 ENCOUNTER — Encounter: Payer: Self-pay | Admitting: Internal Medicine

## 2012-06-08 NOTE — Progress Notes (Signed)
  Subjective:    Patient ID: Angel Weber, female    DOB: 02/04/1986, 26 y.o.   MRN: [id number]  HPI  Here to f/u;  C/o recurrance of rash to the hairline and forehead with marked itching, some swelling and irritation, also noted some rash to malar area today - ? More acne like.  Had similar episode to hairline about 2 mo ago, better with steroid tx.  No obvious cause, using the same shampoo and no other recent hair treatment.  Rash has extended to the right post neck as well.  Has low grade temp today but unaware of any fever, pain, drainage or d/c.  No overt sinus, ear, or throat pain or other symptoms such as cough.  Pt denies chest pain, increased sob or doe, wheezing, orthopnea, PND, increased LE swelling, palpitations, dizziness or syncope.   Past Medical History  Diagnosis Date  . Chlamydia 12/05/2010  . Pregnancy induced hypertension   . Headache    Past Surgical History  Procedure Date  . Wisdom tooth extraction     reports that she has never smoked. She does not have any smokeless tobacco history on file. She reports that she drinks alcohol. She reports that she does not use illicit drugs. family history includes Diabetes in her other; Gout in her father; Hypertension in her brother; and Lung cancer in her maternal uncle and paternal aunt. No Known Allergies Current Outpatient Prescriptions on File Prior to Visit  Medication Sig Dispense Refill  . levonorgestrel-ethinyl estradiol (SEASONALE,INTROVALE,JOLESSA) 0.15-0.03 MG tablet Take 1 tablet by mouth daily.  1 Package  3  . Multiple Vitamin (MULTIVITAMIN) tablet Take 1 tablet by mouth daily.         Review of Systems Review of Systems  Constitutional: Negative for diaphoresis and unexpected weight change.  Eyes: Negative for photophobia and visual disturbance.  Respiratory: Negative for choking and stridor.   Gastrointestinal: Negative for vomiting and blood in stool.  Genitourinary: Negative for hematuria and decreased urine  volume.  Musculoskeletal: Negative for gait problem.  Neurological: Negative for tremors and numbness.      Objective:   Physical Exam BP 120/82  Pulse 88  Temp(Src) 99.1 F (37.3 C) (Oral)  Ht 5\' 4"  (1.626 m)  Wt 164 lb (74.39 kg)  BMI 28.15 kg/m2  SpO2 99%  LMP 04/15/2012 Physical Exam  VS noted, not ill appaering Constitutional: Pt appears well-developed and well-nourished.  HENT: Head: Normocephalic.  Right Ear: External ear normal.  Left Ear: External ear normal.  Eyes: Conjunctivae and EOM are normal. Pupils are equal, round, and reactive to light.  Neck: Normal range of motion. Neck supple.  Cardiovascular: Normal rate and regular rhythm.   Pulmonary/Chest: Effort normal and breath sounds normal.  Neurological: Pt is alert. Not confused Skin: Skin is warm. Has significant erythem plaquelike rash to the hairline and scalp as well as several irreg areas to post right neck, minor tender, no drainage Psychiatric: Pt behavior is normal. Thought content normal.     Assessment & Plan:

## 2012-06-08 NOTE — Assessment & Plan Note (Signed)
C/w inflammatory rash, for depomedrol IM, and predpack, but also refer to derm;  I think the malar area rash is more likely acne like; but also I wonder about discoid lupus

## 2013-06-03 ENCOUNTER — Emergency Department (HOSPITAL_BASED_OUTPATIENT_CLINIC_OR_DEPARTMENT_OTHER)
Admission: EM | Admit: 2013-06-03 | Discharge: 2013-06-03 | Disposition: A | Discharge status: Home / Self Care | Payer: 59 | Attending: Emergency Medicine | Admitting: Emergency Medicine

## 2013-06-03 ENCOUNTER — Encounter (HOSPITAL_BASED_OUTPATIENT_CLINIC_OR_DEPARTMENT_OTHER): Payer: Self-pay | Admitting: Emergency Medicine

## 2013-06-03 DIAGNOSIS — IMO0001 Reserved for inherently not codable concepts without codable children: Secondary | ICD-10-CM | POA: Insufficient documentation

## 2013-06-03 DIAGNOSIS — Z8742 Personal history of other diseases of the female genital tract: Secondary | ICD-10-CM | POA: Insufficient documentation

## 2013-06-03 DIAGNOSIS — S40269A Insect bite (nonvenomous) of unspecified shoulder, initial encounter: Secondary | ICD-10-CM | POA: Insufficient documentation

## 2013-06-03 DIAGNOSIS — Z79899 Other long term (current) drug therapy: Secondary | ICD-10-CM | POA: Insufficient documentation

## 2013-06-03 DIAGNOSIS — Y92009 Unspecified place in unspecified non-institutional (private) residence as the place of occurrence of the external cause: Secondary | ICD-10-CM | POA: Insufficient documentation

## 2013-06-03 DIAGNOSIS — W57XXXA Bitten or stung by nonvenomous insect and other nonvenomous arthropods, initial encounter: Secondary | ICD-10-CM | POA: Insufficient documentation

## 2013-06-03 DIAGNOSIS — Y9389 Activity, other specified: Secondary | ICD-10-CM | POA: Insufficient documentation

## 2013-06-03 DIAGNOSIS — Z8619 Personal history of other infectious and parasitic diseases: Secondary | ICD-10-CM | POA: Insufficient documentation

## 2013-06-03 MED ORDER — DOXYCYCLINE HYCLATE 100 MG PO TABS
100.0000 mg | ORAL_TABLET | Freq: Once | ORAL | Status: AC
  Administered 2013-06-03: 100 mg via ORAL
  Filled 2013-06-03: qty 1

## 2013-06-03 MED ORDER — DOXYCYCLINE HYCLATE 100 MG PO CAPS: 100.0000 mg | ORAL_CAPSULE | Freq: Two times a day (BID) | ORAL | Status: DC

## 2013-06-03 NOTE — ED Notes (Signed)
MD at bedside. 

## 2013-06-03 NOTE — ED Provider Notes (Signed)
History  This chart was scribed for Angel Weber Smitty Cords, MD by Bennett Scrape, ED Scribe. This patient was seen in room MH12/MH12 and the patient's care was started at 11:02 PM.  CSN: [id number]  Arrival date & time 06/03/13  2227   First MD Initiated Contact with Patient 06/03/13 2302      Chief Complaint  Patient presents with  . Insect Bite     Patient is a 27 y.o. female presenting with animal bite. The history is provided by the patient. No language interpreter was used.  Animal Bite Contact animal:  Insect Location:  Shoulder/arm Shoulder/arm injury location:  R arm and R shoulder Time since incident:  3 days Pain details:    Quality:  Itching   Timing:  Constant   Progression:  Worsening Incident location:  Outside Relieved by:  OTC medications Associated symptoms: no fever     HPI Comments: Angel Weber is a 27 y.o. female who presents to the Emergency Department complaining of gradual onset, gradually worsening, constant insect bits on right arm and right shoulder with associated warmth and redness that she noticed 3 days ago after standing outside her sister's house. Pt has taken benadryl po and used benadryl spray with no improvement. She denies that she has been scratching the area. She denies fevers, chills, nausea and emesis as associated symptoms. Pt does not have a h/o chronic medical conditions and is an occasional alcohol user but denies smoking.    Past Medical History  Diagnosis Date  . Chlamydia 12/05/2010  . Pregnancy induced hypertension   . JYNWGNFA(213.0)     Past Surgical History  Procedure Laterality Date  . Wisdom tooth extraction      Family History  Problem Relation Age of Onset  . Gout Father   . Hypertension Brother   . Lung cancer Maternal Uncle   . Lung cancer Paternal Aunt   . Diabetes Other     History  Substance Use Topics  . Smoking status: Never Smoker   . Smokeless tobacco: Not on file  . Alcohol Use: Yes     OB History   Grav Para Term Preterm Abortions TAB SAB Ect Mult Living   0               Review of Systems  Constitutional: Negative for fever and chills.  Gastrointestinal: Negative for nausea and vomiting.  Skin: Positive for color change.  All other systems reviewed and are negative.    Allergies  Review of patient's allergies indicates no known allergies.  Home Medications   Current Outpatient Rx  Name  Route  Sig  Dispense  Refill  . levonorgestrel-ethinyl estradiol (SEASONALE,INTROVALE,JOLESSA) 0.15-0.03 MG tablet   Oral   Take 1 tablet by mouth daily.   1 Package   3   . Multiple Vitamin (MULTIVITAMIN) tablet   Oral   Take 1 tablet by mouth daily.           . predniSONE (DELTASONE) 10 MG tablet   Oral   Take 1 tablet (10 mg total) by mouth daily. 3 tabs by mouth per day for 3 days,2tabs per day for 3 days,1tab per day for 3 days   18 tablet   0     Triage Vitals: BP 126/78  Pulse 90  Temp(Src) 98.9 F (37.2 C) (Oral)  Resp 20  Ht 5\' 4"  (1.626 m)  Wt 160 lb (72.576 kg)  BMI 27.45 kg/m2  SpO2 100%  Physical Exam  Nursing  note and vitals reviewed. Constitutional: She is oriented to person, place, and time. She appears well-developed and well-nourished. No distress.  HENT:  Head: Normocephalic and atraumatic.  Mouth/Throat: Oropharynx is clear and moist.  Moist MM  Eyes: Conjunctivae and EOM are normal. Pupils are equal, round, and reactive to light.  Neck: Normal range of motion. Neck supple. No tracheal deviation present.  Cardiovascular: Normal rate, regular rhythm, normal heart sounds and intact distal pulses.   Pulmonary/Chest: Effort normal and breath sounds normal. No respiratory distress.  Abdominal: Soft. Bowel sounds are normal. There is no tenderness. There is no rebound and no guarding.  Musculoskeletal: Normal range of motion.  Lymphadenopathy:    She has no cervical adenopathy.  Neurological: She is alert and oriented to person,  place, and time.  Skin: Skin is warm and dry.  3 mm and 5 mm lesions on the neck, 1 cm lesion with erythema on the RUE, 12 cm lesion to the RUE warmth and erythema, no fluctuance  Psychiatric: She has a normal mood and affect. Her behavior is normal.    ED Course  Procedures (including critical care time)  DIAGNOSTIC STUDIES: Oxygen Saturation is 100% on room air, normal by my interpretation.    COORDINATION OF CARE: 11:26 PM-Discussed treatment plan which includes antibiotic with pt at bedside and pt agreed to plan.  Advised pt to use condoms for a full month to decrease chance of pregnancy while on antibiotics.   Labs Reviewed - No data to display No results found.   No diagnosis found.    MDM  Secondarily infected insect bites, will treat      I personally performed the services described in this documentation, which was scribed in my presence. The recorded information has been reviewed and is accurate.     Jasmine Awe, MD 06/04/13 [phone]

## 2013-06-03 NOTE — ED Notes (Signed)
Pt has several insect bits on right arm and right shoulder. Pt has taken benadryl po and used benadryl spray

## 2017-06-29 ENCOUNTER — Inpatient Hospital Stay (HOSPITAL_COMMUNITY)
Admission: AD | Admit: 2017-06-29 | Discharge: 2017-06-29 | Disposition: A | Discharge status: Home / Self Care | Payer: Medicaid Other | Source: Ambulatory Visit | Attending: Obstetrics & Gynecology | Admitting: Obstetrics & Gynecology

## 2017-06-29 ENCOUNTER — Encounter (HOSPITAL_COMMUNITY): Payer: Self-pay

## 2017-06-29 DIAGNOSIS — O23591 Infection of other part of genital tract in pregnancy, first trimester: Secondary | ICD-10-CM | POA: Insufficient documentation

## 2017-06-29 DIAGNOSIS — O219 Vomiting of pregnancy, unspecified: Secondary | ICD-10-CM | POA: Diagnosis present

## 2017-06-29 DIAGNOSIS — B9689 Other specified bacterial agents as the cause of diseases classified elsewhere: Secondary | ICD-10-CM

## 2017-06-29 DIAGNOSIS — Z79899 Other long term (current) drug therapy: Secondary | ICD-10-CM | POA: Insufficient documentation

## 2017-06-29 DIAGNOSIS — N760 Acute vaginitis: Secondary | ICD-10-CM

## 2017-06-29 DIAGNOSIS — Z3A12 12 weeks gestation of pregnancy: Secondary | ICD-10-CM | POA: Diagnosis not present

## 2017-06-29 LAB — WET PREP, GENITAL
Sperm: NONE SEEN
TRICH WET PREP: NONE SEEN
Yeast Wet Prep HPF POC: NONE SEEN

## 2017-06-29 LAB — URINALYSIS, ROUTINE W REFLEX MICROSCOPIC
Bilirubin Urine: NEGATIVE
Glucose, UA: NEGATIVE mg/dL
Hgb urine dipstick: NEGATIVE
KETONES UR: NEGATIVE mg/dL
Nitrite: POSITIVE — AB
PROTEIN: NEGATIVE mg/dL
Specific Gravity, Urine: 1.021 (ref 1.005–1.030)
pH: 5 (ref 5.0–8.0)

## 2017-06-29 LAB — CBC
HCT: 36 % (ref 36.0–46.0)
Hemoglobin: 12.3 g/dL (ref 12.0–15.0)
MCH: 29.6 pg (ref 26.0–34.0)
MCHC: 34.2 g/dL (ref 30.0–36.0)
MCV: 86.7 fL (ref 78.0–100.0)
PLATELETS: 258 10*3/uL (ref 150–400)
RBC: 4.15 MIL/uL (ref 3.87–5.11)
RDW: 13.5 % (ref 11.5–15.5)
WBC: 7 10*3/uL (ref 4.0–10.5)

## 2017-06-29 MED ORDER — METRONIDAZOLE 500 MG PO TABS: 500.0000 mg | ORAL_TABLET | Freq: Two times a day (BID) | ORAL | 0 refills | Status: AC

## 2017-06-29 MED ORDER — PROMETHAZINE HCL 25 MG PO TABS
25.0000 mg | ORAL_TABLET | Freq: Four times a day (QID) | ORAL | Status: DC | PRN
  Administered 2017-06-29: 25 mg via ORAL
  Filled 2017-06-29: qty 1

## 2017-06-29 MED ORDER — PROMETHAZINE HCL 25 MG PO TABS: 25.0000 mg | ORAL_TABLET | Freq: Four times a day (QID) | ORAL | 0 refills | Status: AC | PRN

## 2017-06-29 MED ORDER — FAMOTIDINE 20 MG PO TABS
20.0000 mg | ORAL_TABLET | Freq: Every day | ORAL | Status: DC
  Administered 2017-06-29: 20 mg via ORAL
  Filled 2017-06-29: qty 1

## 2017-06-29 NOTE — Discharge Instructions (Signed)
Morning Sickness °Morning sickness is when you feel sick to your stomach (nauseous) during pregnancy. You may feel sick to your stomach and throw up (vomit). You may feel sick in the morning, but you can feel this way any time of day. Some women feel very sick to their stomach and cannot stop throwing up (hyperemesis gravidarum). °Follow these instructions at home: °· Only take medicines as told by your doctor. °· Take multivitamins as told by your doctor. Taking multivitamins before getting pregnant can stop or lessen the harshness of morning sickness. °· Eat dry toast or unsalted crackers before getting out of bed. °· Eat 5 to 6 small meals a day. °· Eat dry and bland foods like rice and baked potatoes. °· Do not drink liquids with meals. Drink between meals. °· Do not eat greasy, fatty, or spicy foods. °· Have someone cook for you if the smell of food causes you to feel sick or throw up. °· If you feel sick to your stomach after taking prenatal vitamins, take them at night or with a snack. °· Eat protein when you need a snack (nuts, yogurt, cheese). °· Eat unsweetened gelatins for dessert. °· Wear a bracelet used for sea sickness (acupressure wristband). °· Go to a doctor that puts thin needles into certain body points (acupuncture) to improve how you feel. °· Do not smoke. °· Use a humidifier to keep the air in your house free of odors. °· Get lots of fresh air. °Contact a doctor if: °· You need medicine to feel better. °· You feel dizzy or lightheaded. °· You are losing weight. °Get help right away if: °· You feel very sick to your stomach and cannot stop throwing up. °· You pass out (faint). °This information is not intended to replace advice given to you by your health care provider. Make sure you discuss any questions you have with your health care provider. °Document Released: 01/24/2005 Document Revised: 05/24/2016 Document Reviewed: 06/03/2013 °Elsevier Interactive Patient Education © 2017 [employer]  [employer] ° °Bacterial Vaginosis °Bacterial vaginosis is an infection of the vagina. It happens when too many germs (bacteria) grow in the vagina. This infection puts you at risk for infections from sex (STIs). Treating this infection can lower your risk for some STIs. You should also treat this if you are pregnant. It can cause your baby to be born early. °Follow these instructions at home: °Medicines °· Take over-the-counter and prescription medicines only as told by your doctor. °· Take or use your antibiotic medicine as told by your doctor. Do not stop taking or using it even if you start to feel better. °General instructions °· If you your sexual partner is a woman, tell her that you have this infection. She needs to get treatment if she has symptoms. If you have a female partner, he does not need to be treated. °· During treatment: °? Avoid sex. °? Do not douche. °? Avoid alcohol as told. °? Avoid breastfeeding as told. °· Drink enough fluid to keep your pee (urine) clear or pale yellow. °· Keep your vagina and butt (rectum) clean. °? Wash the area with warm water every day. °? Wipe from front to back after you use the toilet. °· Keep all follow-up visits as told by your doctor. This is important. °Preventing this condition °· Do not douche. °· Use only warm water to wash around your vagina. °· Use protection when you have sex. This includes: °? Latex condoms. °? Dental dams. °· Limit how   many people you have sex with. It is best to only have sex with the same person (be monogamous). °· Get tested for STIs. Have your partner get tested. °· Wear underwear that is cotton or lined with cotton. °· Avoid tight pants and pantyhose. This is most important in summer. °· Do not use any products that have nicotine or tobacco in them. These include cigarettes and e-cigarettes. If you need help quitting, ask your doctor. °· Do not use illegal drugs. °· Limit how much alcohol you drink. °Contact a doctor if: °· Your symptoms do  not get better, even after you are treated. °· You have more discharge or pain when you pee (urinate). °· You have a fever. °· You have pain in your belly (abdomen). °· You have pain with sex. °· Your bleed from your vagina between periods. °Summary °· This infection happens when too many germs (bacteria) grow in the vagina. °· Treating this condition can lower your risk for some infections from sex (STIs). °· You should also treat this if you are pregnant. It can cause early (premature) birth. °· Do not stop taking or using your antibiotic medicine even if you start to feel better. °This information is not intended to replace advice given to you by your health care provider. Make sure you discuss any questions you have with your health care provider. °Document Released: 09/25/2008 Document Revised: 09/01/2016 Document Reviewed: 09/01/2016 °Elsevier Interactive Patient Education © 2017 Elsevier Inc. ° °

## 2017-06-29 NOTE — MAU Provider Note (Signed)
History     CSN: [id number]  Arrival date and time: 06/29/17 0227   First Provider Initiated Contact with Patient 06/29/17 0308      Chief Complaint  Patient presents with  . Emesis During Pregnancy   G2P1001 @12 .2 wks here with nausea since last night. Felt nauseas and hot and had emesis x1. Saw blood streaks in vomit. No emesis since. No sick contacts. No diarrhea. Also reports lower abdominal pressure/pain x2 hrs. No vaginal bleeding or discharge. No fevers. No urinary sx. She's concerned because her first PN appt at [facility] is scheduled for late July.    OB History    Gravida Para Term Preterm AB Living   2 1 1     1    SAB TAB Ectopic Multiple Live Births           1      Past Medical History:  Diagnosis Date  . Chlamydia 12/05/2010  . Headache(784.0)   . Pregnancy induced hypertension     Past Surgical History:  Procedure Laterality Date  . WISDOM TOOTH EXTRACTION      Family History  Problem Relation Age of Onset  . Gout Father   . Hypertension Brother   . Lung cancer Maternal Uncle   . Lung cancer Paternal Aunt   . Diabetes Other     Social History  Substance Use Topics  . Smoking status: Never Smoker  . Smokeless tobacco: Never Used  . Alcohol use Yes    Allergies: No Known Allergies  Prescriptions Prior to Admission  Medication Sig Dispense Refill Last Dose  . Multiple Vitamin (MULTIVITAMIN) tablet Take 1 tablet by mouth daily.     06/28/2017 at Unknown time  . doxycycline (VIBRAMYCIN) 100 MG capsule Take 1 capsule (100 mg total) by mouth 2 (two) times daily. One po bid x 7 days 14 capsule 0   . levonorgestrel-ethinyl estradiol (SEASONALE,INTROVALE,JOLESSA) 0.15-0.03 MG tablet Take 1 tablet by mouth daily. 1 Package 3 Taking  . predniSONE (DELTASONE) 10 MG tablet Take 1 tablet (10 mg total) by mouth daily. 3 tabs by mouth per day for 3 days,2tabs per day for 3 days,1tab per day for 3 days 18 tablet 0     Review of Systems  Constitutional: Negative  for fever.  Gastrointestinal: Positive for abdominal pain, nausea and vomiting. Negative for diarrhea.  Genitourinary: Negative for dysuria, vaginal bleeding and vaginal discharge.   Physical Exam   Blood pressure 133/64, pulse (!) 104, temperature 98.2 F (36.8 C), temperature source Oral, resp. rate 18, height 5\' 4"  (1.626 m), weight 79.4 kg (175 lb), last menstrual period 04/04/2017, SpO2 100 %.  Physical Exam  Constitutional: She is oriented to person, place, and time. She appears well-developed and well-nourished. No distress.  HENT:  Head: Normocephalic and atraumatic.  Neck: Normal range of motion.  Respiratory: Effort normal. No respiratory distress.  GI: Soft. She exhibits no distension and no mass. There is no tenderness. There is no rebound and no guarding.  Musculoskeletal: Normal range of motion.  Neurological: She is alert and oriented to person, place, and time.  Skin: Skin is warm and dry.  Psychiatric: She has a normal mood and affect.   FHT 160 bpm  Limited bedside US: viable, active fetus, +cardiac activity, subj. nml AFV  Results for orders placed or performed during the hospital encounter of 06/29/17 (from the past 24 hour(s))  Urinalysis, Routine w reflex microscopic     Status: Abnormal   Collection Time: 06/29/17  2:40 AM  Result Value Ref Range   Color, Urine YELLOW YELLOW   APPearance CLEAR CLEAR   Specific Gravity, Urine 1.021 1.005 - 1.030   pH 5.0 5.0 - 8.0   Glucose, UA NEGATIVE NEGATIVE mg/dL   Hgb urine dipstick NEGATIVE NEGATIVE   Bilirubin Urine NEGATIVE NEGATIVE   Ketones, ur NEGATIVE NEGATIVE mg/dL   Protein, ur NEGATIVE NEGATIVE mg/dL   Nitrite POSITIVE (A) NEGATIVE   Leukocytes, UA SMALL (A) NEGATIVE   RBC / HPF 0-5 0 - 5 RBC/hpf   WBC, UA 6-30 0 - 5 WBC/hpf   Bacteria, UA MANY (A) NONE SEEN   Squamous Epithelial / LPF 0-5 (A) NONE SEEN   Mucous PRESENT   Wet prep, genital     Status: Abnormal   Collection Time: 06/29/17  3:27 AM   Result Value Ref Range   Yeast Wet Prep HPF POC NONE SEEN NONE SEEN   Trich, Wet Prep NONE SEEN NONE SEEN   Clue Cells Wet Prep HPF POC PRESENT (A) NONE SEEN   WBC, Wet Prep HPF POC FEW (A) NONE SEEN   Sperm NONE SEEN   CBC     Status: None   Collection Time: 06/29/17  3:32 AM  Result Value Ref Range   WBC 7.0 4.0 - 10.5 K/uL   RBC 4.15 3.87 - 5.11 MIL/uL   Hemoglobin 12.3 12.0 - 15.0 g/dL   HCT 96.036.0 45.436.0 - 09.846.0 %   MCV 86.7 78.0 - 100.0 fL   MCH 29.6 26.0 - 34.0 pg   MCHC 34.2 30.0 - 36.0 g/dL   RDW 11.913.5 14.711.5 - 82.915.5 %   Platelets 258 150 - 400 K/uL   MAU Course  Procedures Phenergan 25 mg po  MDM Labs ordered and reviewed. No emesis while here. Will treat BV. UA w/many bacteria and nitrites, pt asymptomatic therefore will send UC. Stable for discharge home.   Assessment and Plan   1. [redacted] weeks gestation of pregnancy   2. Nausea/vomiting in pregnancy   3. Bacterial vaginosis    Discharge home Follow up with [facility] as scheduled OB provider list provided Rx Phenergan Rx Flagyl  Allergies as of 06/29/2017   No Known Allergies     Medication List    STOP taking these medications   doxycycline 100 MG capsule Commonly known as:  VIBRAMYCIN   levonorgestrel-ethinyl estradiol 0.15-0.03 MG tablet Commonly known as:  SEASONALE,INTROVALE,JOLESSA   predniSONE 10 MG tablet Commonly known as:  DELTASONE     TAKE these medications   metroNIDAZOLE 500 MG tablet Commonly known as:  FLAGYL Take 1 tablet (500 mg total) by mouth 2 (two) times daily.   multivitamin tablet Take 1 tablet by mouth daily.   promethazine 25 MG tablet Commonly known as:  PHENERGAN Take 1 tablet (25 mg total) by mouth every 6 (six) hours as needed for nausea or vomiting.      Donette LarryMelanie Schyler Butikofer, CNM 06/29/2017, 3:28 AM

## 2017-06-29 NOTE — MAU Note (Signed)
Earlier was felt hot and nauseated. Vomited once and saw pieces of blood, specs of blood in saliva. Still nauseated. Mild pain in lower abd since 1 am. [facility], called them today and was unable to get a sooner appointment. No vaginal bleeding.

## 2017-07-01 LAB — CULTURE, OB URINE: Culture: >=100000 — AB

## 2017-07-01 LAB — GC/CHLAMYDIA PROBE AMP (~~LOC~~) NOT AT ARMC
Chlamydia: NEGATIVE
NEISSERIA GONORRHEA: NEGATIVE

## 2017-07-02 ENCOUNTER — Telehealth: Payer: Self-pay | Admitting: Obstetrics and Gynecology

## 2017-07-02 MED ORDER — CEPHALEXIN 500 MG PO CAPS: 500.0000 mg | ORAL_CAPSULE | Freq: Four times a day (QID) | ORAL | 0 refills | Status: AC

## 2017-07-02 NOTE — Telephone Encounter (Signed)
+   Urine culture Rx for Keflex to the pharmacy. Allergies confirmed Patient aware and agreeable to the plan of care  Deetya Drouillard, Harolyn RutherfordJennifer I, NP

## 2018-04-03 ENCOUNTER — Encounter (HOSPITAL_COMMUNITY): Payer: Self-pay

## 2023-06-08 DIAGNOSIS — F4310 Post-traumatic stress disorder, unspecified: Secondary | ICD-10-CM | POA: Diagnosis not present

## 2023-06-16 DIAGNOSIS — F4310 Post-traumatic stress disorder, unspecified: Secondary | ICD-10-CM | POA: Diagnosis not present

## 2023-07-14 DIAGNOSIS — F4310 Post-traumatic stress disorder, unspecified: Secondary | ICD-10-CM | POA: Diagnosis not present

## 2023-08-07 DIAGNOSIS — F4310 Post-traumatic stress disorder, unspecified: Secondary | ICD-10-CM | POA: Diagnosis not present

## 2023-08-15 DIAGNOSIS — F4310 Post-traumatic stress disorder, unspecified: Secondary | ICD-10-CM | POA: Diagnosis not present

## 2024-03-15 DIAGNOSIS — B9689 Other specified bacterial agents as the cause of diseases classified elsewhere: Secondary | ICD-10-CM | POA: Diagnosis not present

## 2024-03-15 DIAGNOSIS — J988 Other specified respiratory disorders: Secondary | ICD-10-CM | POA: Diagnosis not present

## 2024-07-01 DIAGNOSIS — R3915 Urgency of urination: Secondary | ICD-10-CM | POA: Diagnosis not present

## 2024-07-01 DIAGNOSIS — J069 Acute upper respiratory infection, unspecified: Secondary | ICD-10-CM | POA: Diagnosis not present

## 2024-11-09 DIAGNOSIS — Z975 Presence of (intrauterine) contraceptive device: Secondary | ICD-10-CM | POA: Diagnosis not present

## 2024-11-09 DIAGNOSIS — Z124 Encounter for screening for malignant neoplasm of cervix: Secondary | ICD-10-CM | POA: Diagnosis not present

## 2024-11-09 DIAGNOSIS — Z01419 Encounter for gynecological examination (general) (routine) without abnormal findings: Secondary | ICD-10-CM | POA: Diagnosis not present

## 2024-11-09 DIAGNOSIS — Z113 Encounter for screening for infections with a predominantly sexual mode of transmission: Secondary | ICD-10-CM | POA: Diagnosis not present
